# Patient Record
Sex: Female | Born: 1990 | Race: White | Hispanic: No | Marital: Single | Smoking: Never smoker
Health system: Southern US, Community
[De-identification: ages and names within clinical notes are randomized; demographics above are authoritative.]

---

## 2015-02-11 ENCOUNTER — Ambulatory Visit (INDEPENDENT_AMBULATORY_CARE_PROVIDER_SITE_OTHER): Payer: 59 | Admitting: Emergency Medicine

## 2015-02-11 VITALS — BP 110/78 | HR 60 | Temp 97.7°F | Resp 16 | Ht 68.25 in | Wt 153.0 lb

## 2015-02-11 DIAGNOSIS — J029 Acute pharyngitis, unspecified: Secondary | ICD-10-CM

## 2015-02-11 LAB — POCT RAPID STREP A (OFFICE): RAPID STREP A SCREEN: NEGATIVE

## 2015-02-11 NOTE — Progress Notes (Signed)
Subjective:  Patient ID: Rose Jennings, female    DOB: 02/16/1990  Age: 25 y.o. MRN: [id number]  CC: Sore Throat   HPI Saja Bartolini presents   Tameika has a sore throat. She is concerned that she might have strep which. 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 She has no fever chills. No nasal congestion postnasal drainage. She has no cough wheezing or shortness of breath. She has no nausea or vomiting. Has no rash.  History Lyza has no past medical history on file.   She has no past surgical history on file.   Her  family history is not on file.  She   reports that she has never smoked. She does not have any smokeless tobacco history on file. Her alcohol and drug histories are not on file.  No outpatient prescriptions prior to visit.   No facility-administered medications prior to visit.    Social History   Social History  . Marital Status: Single    Spouse Name: N/A  . Number of Children: N/A  . Years of Education: N/A   Social History Main Topics  . Smoking status: Never Smoker   . Smokeless tobacco: None  . Alcohol Use: None  . Drug Use: None  . Sexual Activity: Not Asked   Other Topics Concern  . None   Social History Narrative  . None     Review of Systems  Constitutional: Negative for fever, chills and appetite change.  HENT: Positive for sore throat. Negative for congestion, ear pain, postnasal drip and sinus pressure.   Eyes: Negative for pain and redness.  Respiratory: Negative for cough, shortness of breath and wheezing.   Cardiovascular: Negative for leg swelling.  Gastrointestinal: Negative for nausea, vomiting, abdominal pain, diarrhea, constipation and blood in stool.  Endocrine: Negative for polyuria.  Genitourinary: Negative for dysuria, urgency, frequency and flank pain.  Musculoskeletal: Negative for gait problem.  Skin: Negative for rash.    Neurological: Negative for weakness and headaches.  Psychiatric/Behavioral: Negative for confusion and decreased concentration. The patient is not nervous/anxious.     Objective:  BP 110/78 mmHg  Pulse 60  Temp(Src) 97.7 F (36.5 C) (Oral)  Resp 16  Ht 5' 8.25" (1.734 m)  Wt 153 lb (69.4 kg)  BMI 23.08 kg/m2  SpO2 99%  LMP 02/11/2015  Physical Exam  Constitutional: She is oriented to person, place, and time. She appears well-developed and well-nourished. No distress.  HENT:  Head: Normocephalic and atraumatic.  Right Ear: External ear normal.  Left Ear: External ear normal.  Nose: Nose normal.  Eyes: Conjunctivae and EOM are normal. Pupils are equal, round, and reactive to light. No scleral icterus.  Neck: Normal range of motion. Neck supple. No tracheal deviation present.  Cardiovascular: Normal rate, regular rhythm and normal heart sounds.   Pulmonary/Chest: Effort normal. No respiratory distress. She has no wheezes. She has no rales.  Abdominal: She exhibits no mass. There is no tenderness. There is no rebound and no guarding.  Musculoskeletal: She exhibits no edema.  Lymphadenopathy:    She has no cervical adenopathy.  Neurological: She is alert and oriented to person, place, and time. Coordination normal.  Skin: Skin is warm and dry. No rash noted.  Psychiatric: She has a normal mood and affect. Her behavior is normal.      Assessment & Plan:   Magda Paganini  was seen today for sore throat.  Diagnoses and all orders for this visit:  Pharyngitis -     POCT rapid strep A   Ms. Velda ShellHubert does not currently have medications on file.  No orders of the defined types were placed in this encounter.    Appropriate red flag conditions were discussed with the patient as well as actions that should be taken.  Patient expressed his understanding.  Follow-up: Return if symptoms worsen or fail to improve.  Carmelina DaneAnderson, Jeffery S, MD

## 2015-02-11 NOTE — Patient Instructions (Signed)

## 2015-02-26 ENCOUNTER — Ambulatory Visit (INDEPENDENT_AMBULATORY_CARE_PROVIDER_SITE_OTHER): Payer: 59 | Admitting: Physician Assistant

## 2015-02-26 ENCOUNTER — Ambulatory Visit (INDEPENDENT_AMBULATORY_CARE_PROVIDER_SITE_OTHER): Payer: 59

## 2015-02-26 VITALS — BP 110/70 | HR 63 | Temp 99.2°F | Resp 18 | Ht 69.0 in | Wt 153.6 lb

## 2015-02-26 DIAGNOSIS — S92912A Unspecified fracture of left toe(s), initial encounter for closed fracture: Secondary | ICD-10-CM | POA: Diagnosis not present

## 2015-02-26 DIAGNOSIS — M79675 Pain in left toe(s): Secondary | ICD-10-CM | POA: Diagnosis not present

## 2015-02-26 NOTE — Patient Instructions (Addendum)
Because you received an x-ray today, you will receive an invoice from [facility]. Please contact [facility] at [phone] with questions or concerns regarding your invoice. Our billing staff will not be able to assist you with those questions.  Wear the post-op shoe for comfort. When you can wear a normal shoe without pain, you may.  Use OTC ibuprofen and/or acetaminophen as needed for pain.  Ice today and tomorrow (15-20 minutes 2-4 times each day) can help reduce the inflammation and pain.  Keep your foot elevated whenever you can. Minimize standing and walking as best you can.

## 2015-02-26 NOTE — Progress Notes (Signed)
   Patient ID: Rose Jennings, female    DOB: Oct 10, 1990, 25 y.o.   MRN: [id number]  PCP: No primary care provider on file.  Subjective:   Chief Complaint  Patient presents with  . Toe Injury    Poss. broken, yesterday  left side    HPI Presents for evaluation of a possible broken first digit of the left foot.   Acute injury occurred yesterday when a table fell from aproximately 4 feet and landed directly on the toe. Associated swelling, erythema, bruising, and difficulty with movement or ambulating. Denies radiation, including extension into ankle other digits of the foot. Has tried  Ibuprofen on 3 occassions that has helped relieve the pain. Constant pain is rated 5/10, with attempts to ambulate or move rated 6-7/10. Denies fever, chills, or numbness. .  She is accompanied by her sister.    Review of Systems  Musculoskeletal: Positive for arthralgias (LEFT great toe).  Skin: Color change: bruising, LEFT great toe.  Neurological: Negative for dizziness, weakness and numbness.       There are no active problems to display for this patient.    Prior to Admission medications   Not on File     Allergies  Allergen Reactions  . Sulfa Antibiotics     bruises       Objective:  Physical Exam  Constitutional: She is oriented to person, place, and time. She appears well-developed and well-nourished. She is active and cooperative. No distress.  BP 110/70 mmHg  Pulse 63  Temp(Src) 99.2 F (37.3 C) (Oral)  Resp 18  Ht  (1.753 m)  Wt 153 lb 9.6 oz (69.673 kg)  BMI 22.67 kg/m2  SpO2 98%  LMP 02/11/2015   Eyes: Conjunctivae are normal.  Pulmonary/Chest: Effort normal.  Musculoskeletal:       Left foot: There is tenderness, bony tenderness and swelling. There is normal range of motion, normal capillary refill, no crepitus, no deformity and no laceration.       Feet:  Neurological: She is alert and oriented to person, place, and time.  Psychiatric: She has a  normal mood and affect. Her speech is normal and behavior is normal.       Dg Toe Great Left  02/26/2015  CLINICAL DATA:  Crush injury to toe.  Dropped a table 1 it. EXAM: LEFT GREAT TOE COMPARISON:  None. FINDINGS: There is a corner fracture involving the lateral base of the great toe distal phalanx. Otherwise no acute bony abnormality is evident. Overlying soft tissues are unremarkable. IMPRESSION: Tiny corner fracture involving the lateral base of the great toe distal phalanx. Electronically Signed   By: Kennith Center M.D.   On: 02/26/2015 15:18        Assessment & Plan:   1. Pain of toe of left foot 2. Fracture, phalanx, foot, left, closed, initial encounter Fx of the LEFT great toe. Non-displaced. Post-op shoe. OTC NSAIDS. Anticipatory guidance.  - DG Toe Great Left; Future  Return for re-evaluation in 4-6 weeks.    Fernande Bras, PA-C Physician Assistant-Certified [facility]

## 2015-02-26 NOTE — Progress Notes (Signed)
   Subjective:    Patient ID: Rose Jennings, female    DOB: [birth year], 25 y.o.   MRN: [id number]  Chief Complaint  Patient presents with  . Toe Injury    Poss. broken, yesterday  left side    HPI Patient presents with a possible broken first digit of the left foot. Acute injury occurred yesterday when a table fell from aproximately 4 feet and landed directly on the toe. Associated swelling, erythema, bruising, and difficulty with movement or ambulating. Denies radiation, including extension into ankle other digits of the foot.  Has tried  Ibuprofen on 3 occassions that has helped relieve the pain. Constant pain is rated 5/10, with attempts to ambulate or move rated 6-7/10. Denies fever, chills, or numbness.   Review of Systems  Constitutional: Negative for fever and chills.  Neurological: Negative for numbness.   Allergies  Allergen Reactions  . Sulfa Antibiotics     bruises   Prior to Admission medications   Not on File   There are no active problems to display for this patient.     Objective:   Physical Exam  Constitutional: She appears well-developed and well-nourished. No distress.  BP 110/70 mmHg  Pulse 63  Temp(Src) 99.2 F (37.3 C) (Oral)  Resp 18  Ht  (1.753 m)  Wt 153 lb 9.6 oz (69.673 kg)  BMI 22.67 kg/m2  SpO2 98%  LMP 02/11/2015    MSK:  Ankle: No bony deformities, no tenderness on palpation. ROM normal and symmetric with flexion, extension, inversion and eversion b/l. Srength 5+ B/l Right foot: No bony deformities. No tenderness on palpation. Digits ROM intact.  Left Foot: No palpable deformities. Bony tenderness with compression located from the first digit extending into the medial foot. Limited active ROM of digits with flexion. Normal ROM with extension. Normal passive ROM on flexion.   Neuro Sharp and dull sensation intact b/l ankles, feet and digits. Notable antalgic gait favoring ipsilateral leg to injured toe. CVD Posterior tibalis and  dorsalis pedis pulses 2+ b/l.    Dg Toe Great Left  02/26/2015  CLINICAL DATA:  Crush injury to toe.  Dropped a table 1 it. EXAM: LEFT GREAT TOE COMPARISON:  None. FINDINGS: There is a corner fracture involving the lateral base of the great toe distal phalanx. Otherwise no acute bony abnormality is evident. Overlying soft tissues are unremarkable. IMPRESSION: Tiny corner fracture involving the lateral base of the great toe distal phalanx. Electronically Signed   By: Kennith Center M.D.   On: 02/26/2015 15:18      Assessment & Plan:  1. Pain of toe of left foot 2. Fracture, phalanx, foot, left, closed, initial encounter - DG Toe Great Left; Future  Return for re-evaluation in 4-6 weeks.

## 2016-08-31 ENCOUNTER — Ambulatory Visit (INDEPENDENT_AMBULATORY_CARE_PROVIDER_SITE_OTHER): Payer: 59 | Admitting: Physician Assistant

## 2016-08-31 ENCOUNTER — Encounter: Payer: Self-pay | Admitting: Physician Assistant

## 2016-08-31 VITALS — BP 116/76 | HR 70 | Temp 98.4°F | Resp 17 | Ht 67.0 in | Wt 161.5 lb

## 2016-08-31 DIAGNOSIS — R3989 Other symptoms and signs involving the genitourinary system: Secondary | ICD-10-CM

## 2016-08-31 DIAGNOSIS — R300 Dysuria: Secondary | ICD-10-CM

## 2016-08-31 DIAGNOSIS — Z7251 High risk heterosexual behavior: Secondary | ICD-10-CM | POA: Diagnosis not present

## 2016-08-31 DIAGNOSIS — A6000 Herpesviral infection of urogenital system, unspecified: Secondary | ICD-10-CM

## 2016-08-31 DIAGNOSIS — R350 Frequency of micturition: Secondary | ICD-10-CM | POA: Diagnosis not present

## 2016-08-31 LAB — POCT URINALYSIS DIP (MANUAL ENTRY)
BILIRUBIN UA: NEGATIVE
GLUCOSE UA: NEGATIVE mg/dL
Ketones, POC UA: NEGATIVE mg/dL
NITRITE UA: NEGATIVE
Protein Ur, POC: NEGATIVE mg/dL
Spec Grav, UA: 1.01 (ref 1.010–1.025)
Urobilinogen, UA: 0.2 E.U./dL
pH, UA: 6.5 (ref 5.0–8.0)

## 2016-08-31 LAB — POCT WET + KOH PREP
TRICH BY WET PREP: ABSENT
YEAST BY KOH: ABSENT
Yeast by wet prep: ABSENT

## 2016-08-31 LAB — POC MICROSCOPIC URINALYSIS (UMFC): MUCUS RE: ABSENT

## 2016-08-31 MED ORDER — VALACYCLOVIR HCL 1 G PO TABS: 1000.0000 mg | ORAL_TABLET | Freq: Two times a day (BID) | ORAL | 0 refills | Status: AC

## 2016-08-31 MED ORDER — VALACYCLOVIR HCL 500 MG PO TABS: ORAL_TABLET | ORAL | 1 refills | Status: AC

## 2016-08-31 MED ORDER — NITROFURANTOIN MONOHYD MACRO 100 MG PO CAPS: 100.0000 mg | ORAL_CAPSULE | Freq: Two times a day (BID) | ORAL | 0 refills | Status: AC

## 2016-08-31 NOTE — Patient Instructions (Addendum)
Your physical exam findings are consistent with genital HSV. We should have your lab results back next week and will contact you with these results. In the meantime, take valtrex as prescribed for first episode. I have given you a refill for this medication to use for future recurrences. The dosing is different for the first episode and the recurrence so make sure you check the Rx bottles when you pick them up. Contact our office if you have any questions. I recommend refraining from sexual intercourse until all results are back and using condoms in the future during sexual intercourse.   Your results indicate you have a UTI. I have given you a prescription for an antibiotic. Please take with food. I have sent off a urine culture and we should have those results in 48 hours. If your symptoms worsen while you are awaiting these results or you develop fever, chills, flank pian, nausea and vomiting, please seek care immediately.   Thank you for letting me participate in your health and well being.  Genital Herpes Genital herpes is a common sexually transmitted infection (STI) that is caused by a virus. The virus spreads from person to person through sexual contact. Infection can cause itching, blisters, and sores around the genitals or rectum. Symptoms may last several days and then go away This is called an outbreak. However, the virus remains in your body, so you may have more outbreaks in the future. The time between outbreaks varies and can be months or years. Genital herpes affects men and women. It is particularly concerning for pregnant women because the virus can be passed to the baby during delivery and can cause serious problems. Genital herpes is also a concern for people who have a weak disease-fighting (immune) system. What are the causes? This condition is caused by the herpes simplex virus (HSV) type 1 or type 2. The virus may spread through:  Sexual contact with an infected person,  including vaginal, anal, and oral sex.  Contact with fluid from a herpes sore.  The skin. This means that you can get herpes from an infected partner even if he or she does not have a visible sore or does not know that he or she is infected.  What increases the risk? You are more likely to develop this condition if:  You have sex with many partners.  You do not use latex condoms during sex.  What are the signs or symptoms? Most people do not have symptoms (asymptomatic) or have mild symptoms that may be mistaken for other skin problems. Symptoms may include:  Small red bumps near the genitals, rectum, or mouth. These bumps turn into blisters and then turn into sores.  Flu-like symptoms, including: ? Fever. ? Body aches. ? Swollen lymph nodes. ? Headache.  Painful urination.  Pain and itching in the genital area or rectal area.  Vaginal discharge.  Tingling or shooting pain in the legs and buttocks.  Generally, symptoms are more severe and last longer during the first (primary) outbreak. Flu-like symptoms are also more common during the primary outbreak. How is this diagnosed? Genital herpes may be diagnosed based on:  A physical exam.  Your medical history.  Blood tests.  A test of a fluid sample (culture) from an open sore.  How is this treated? There is no cure for this condition, but treatment with antiviral medicines that are taken by mouth (orally) can do the following:  Speed up healing and relieve symptoms.  Help to reduce  the spread of the virus to sexual partners.  Limit the chance of future outbreaks, or make future outbreaks shorter.  Lessen symptoms of future outbreaks.  Your health care provider may also recommend pain relief medicines, such as aspirin or ibuprofen. Follow these instructions at home: Sexual activity  Do not have sexual contact during active outbreaks.  Practice safe sex. Latex condoms and female condoms may help prevent the  spread of the herpes virus. General instructions  Keep the affected areas dry and clean.  Take over-the-counter and prescription medicines only as told by your health care provider.  Avoid rubbing or touching blisters and sores. If you do touch blisters or sores: ? Wash your hands thoroughly with soap and water. ? Do not touch your eyes afterward.  To help relieve pain or itching, you may take the following actions as directed by your health care provider: ? Apply a cold, wet cloth (cold compress) to affected areas 4-6 times a day. ? Apply a substance that protects your skin and reduces bleeding (astringent). ? Apply a gel that helps relieve pain around sores (lidocaine gel). ? Take a warm, shallow bath that cleans the genital area (sitz bath).  Keep all follow-up visits as told by your health care provider. This is important. How is this prevented?  Use condoms. Although anyone can get genital herpes during sexual contact, even with the use of a condom, a condom can provide some protection.  Avoid having multiple sexual partners.  Talk with your sexual partner about any symptoms either of you may have. Also, talk with your partner about any history of STIs.  Get tested for STIs before you have sex. Ask your partner to do the same.  Do not have sexual contact if you have symptoms of genital herpes. Contact a health care provider if:  Your symptoms are not improving with medicine.  Your symptoms return.  You have new symptoms.  You have a fever.  You have abdominal pain.  You have redness, swelling, or pain in your eye.  You notice new sores on other parts of your body.  You are a woman and experience bleeding between menstrual periods.  You have had herpes and you become pregnant or plan to become pregnant. Summary  Genital herpes is a common sexually transmitted infection (STI) that is caused by the herpes simplex virus (HSV) type 1 or type 2.  These viruses are  most often spread through sexual contact with an infected person.  You are more likely to develop this condition if you have sex with many partners or you have unprotected sex.  Most people do not have symptoms (asymptomatic) or have mild symptoms that may be mistaken for other skin problems. Symptoms occur as outbreaks that may happen months or years apart.  There is no cure for this condition, but treatment with oral antiviral medicines can reduce symptoms, reduce the chance of spreading the virus to a partner, prevent future outbreaks, or shorten future outbreaks. This information is not intended to replace advice given to you by your health care provider. Make sure you discuss any questions you have with your health care provider. Document Released: 01/12/2000 Document Revised: 12/15/2015 Document Reviewed: 12/15/2015 Elsevier Interactive Patient Education  2017 Elsevier Inc.   Urinary Tract Infection, Adult A urinary tract infection (UTI) is an infection of any part of the urinary tract. The urinary tract includes the:  Kidneys.  Ureters.  Bladder.  Urethra.  These organs make, store, and get rid  of pee (urine) in the body. Follow these instructions at home:  Take over-the-counter and prescription medicines only as told by your doctor.  If you were prescribed an antibiotic medicine, take it as told by your doctor. Do not stop taking the antibiotic even if you start to feel better.  Avoid the following drinks: ? Alcohol. ? Caffeine. ? Tea. ? Carbonated drinks.  Drink enough fluid to keep your pee clear or pale yellow.  Keep all follow-up visits as told by your doctor. This is important.  Make sure to: ? Empty your bladder often and completely. Do not to hold pee for long periods of time. ? Empty your bladder before and after sex. ? Wipe from front to back after a bowel movement if you are female. Use each tissue one time when you wipe. Contact a doctor if:  You have  back pain.  You have a fever.  You feel sick to your stomach (nauseous).  You throw up (vomit).  Your symptoms do not get better after 3 days.  Your symptoms go away and then come back. Get help right away if:  You have very bad back pain.  You have very bad lower belly (abdominal) pain.  You are throwing up and cannot keep down any medicines or water. This information is not intended to replace advice given to you by your health care provider. Make sure you discuss any questions you have with your health care provider. Document Released: 07/03/2007 Document Revised: 06/22/2015 Document Reviewed: 12/05/2014 Elsevier Interactive Patient Education  2018 [employer].    IF you received an x-ray today, you will receive an invoice from [facility]. Please contact [facility] at [phone] with questions or concerns regarding your invoice.   IF you received labwork today, you will receive an invoice from [facility]. Please contact [facility] at [phone] with questions or concerns regarding your invoice.   Our billing staff will not be able to assist you with questions regarding bills from these companies.  You will be contacted with the lab results as soon as they are available. The fastest way to get your results is to activate your My Chart account. Instructions are located on the last page of this paperwork. If you have not heard from [place] regarding the results in 2 weeks, please contact this office.

## 2016-08-31 NOTE — Progress Notes (Signed)
Rose Jennings  MRN: [id number] DOB: 11/29/1990  Subjective:  Rose Jennings is a 26 y.o. female seen in office today for a chief complaint of genital sores x 3 days ago. Has associated tingling, burning, and urinary freqency. Denies vaginal itching, vaginal discharge, dysuria, urinary frequency, and hematuria. Last sexually active in 03/2016. Has had 4 lifetime partners. Does not use condoms every time. Has never been tested for STDs.LMP >2 months. Just stopped birth control tablets one month ago. She was skipping placebo pills so she would not have a cycle. For the past week she has had flu like sx, such as general malaise, headache, decreased appetite, and low grade fever all of which have resolved.   Review of Systems  Gastrointestinal: Negative for abdominal pain and nausea.    There are no active problems to display for this patient.   No current outpatient prescriptions on file prior to visit.   No current facility-administered medications on file prior to visit.     Allergies  Allergen Reactions  . Sulfa Antibiotics     bruises     Objective:  BP 116/76   Pulse 70   Temp 98.4 F (36.9 C) (Oral)   Resp 17   Ht 5\' 7"  (1.702 m)   Wt 161 lb 8 oz (73.3 kg)   LMP  (LMP Unknown)   SpO2 100%   BMI 25.29 kg/m   Physical Exam  Constitutional: She is oriented to person, place, and time and well-developed, well-nourished, and in no distress.  HENT:  Head: Normocephalic and atraumatic.  Eyes: Conjunctivae are normal.  Neck: Normal range of motion.  Pulmonary/Chest: Effort normal.  Genitourinary: Vulva exhibits lesion ( Erythematous vesicular lesions on labia minora, exquisitely tender to palpation  ). Thin  white and vaginal discharge found.  Neurological: She is alert and oriented to person, place, and time. Gait normal.  Skin: Skin is warm and dry.  Psychiatric: Affect normal.  Vitals reviewed.   Results for orders placed or performed in visit on 08/31/16 (from the  past 24 hour(s))  POCT urinalysis dipstick     Status: Abnormal   Collection Time: 08/31/16 12:06 PM  Result Value Ref Range   Color, UA yellow yellow   Clarity, UA clear clear   Glucose, UA negative negative mg/dL   Bilirubin, UA negative negative   Ketones, POC UA negative negative mg/dL   Spec Grav, UA 4.0981.010 [id number] - 1.025   Blood, UA trace-lysed (A) negative   pH, UA 6.5 5.0 - 8.0   Protein Ur, POC negative negative mg/dL   Urobilinogen, UA 0.2 0.2 or 1.0 E.U./dL   Nitrite, UA Negative Negative   Leukocytes, UA Small (1+) (A) Negative  POCT Microscopic Urinalysis ([facility])     Status: Abnormal   Collection Time: 08/31/16 12:06 PM  Result Value Ref Range   WBC,UR,HPF,POC Few (A) None WBC/hpf   RBC,UR,HPF,POC None None RBC/hpf   Bacteria Moderate (A) None, Too numerous to count   Mucus Absent Absent   Epithelial Cells, UR Per Microscopy Few (A) None, Too numerous to count cells/hpf  POCT Wet + KOH Prep     Status: Abnormal   Collection Time: 08/31/16 12:06 PM  Result Value Ref Range   Yeast by KOH Absent Absent   Yeast by wet prep Absent Absent   WBC by wet prep Moderate (A) Few   Clue Cells Wet Prep HPF POC None None   Trich by wet prep Absent Absent   Bacteria Wet  Prep HPF POC Many (A) Few   Epithelial Cells By [employer] FinancialWet Pref ([facility]) Moderate (A) None, Few, Too numerous to count   RBC,UR,HPF,POC None None RBC/hpf    Assessment and Plan :  1. Genital sore 2. High risk sexual behavior Labs pending. Pt DECLINES pregnancy test.  - HIV antibody - GC/Chlamydia Probe Amp - RPR - POCT urinalysis dipstick - POCT Microscopic Urinalysis ([facility]) - POCT Wet + KOH Prep - Hepatitis panel, acute  3. Urinary frequency 4. Dysuria UA dip with small leukocytes and urine micro with few WBCs, will treat empirically for UTI. Urine culture pending.  - Urine Culture - nitrofurantoin, macrocrystal-monohydrate, (MACROBID) 100 MG capsule; Take 1 capsule (100 mg total) by mouth 2 (two) times daily.   Dispense: 10 capsule; Refill: 0  5. Genital herpes simplex, unspecified site Hx and PE findings consistent with genital herpes. Will treat empirically at this time. Given 2 Rx's for valtrex, one for 1st episode and one for recurrences. Discussed safe sex practices with patient. Plan to follow up as needed.  - valACYclovir (VALTREX) 1000 MG tablet; Take 1 tablet (1,000 mg total) by mouth 2 (two) times daily. For first outbreak.  Dispense: 20 tablet; Refill: 0 - valACYclovir (VALTREX) 500 MG tablet; For recurrences, take 500mg  BID x 3 days. Start ASAP w/in 24 hours of sx onset.  Dispense: 12 tablet; Refill: 1   Benjiman CoreBrittany Jearl Soto, PA-C  [facility] 08/31/2016 12:44 PM

## 2016-09-02 LAB — HERPES SIMPLEX VIRUS CULTURE

## 2016-09-03 ENCOUNTER — Telehealth: Payer: Self-pay | Admitting: Physician Assistant

## 2016-09-03 LAB — GC/CHLAMYDIA PROBE AMP
Chlamydia trachomatis, NAA: NEGATIVE
Neisseria gonorrhoeae by PCR: NEGATIVE

## 2016-09-03 NOTE — Telephone Encounter (Signed)
[facility] TEST[facility]. SHE WOULD LIKE TO GET THE RESULTS PLEASE. BEST PHONE [phone] (CELL) PHARMACY CHOICE IF NEEDED IS [facility] ON [address] [facility]

## 2016-09-04 LAB — HEPATITIS PANEL, ACUTE
HEP B C IGM: POSITIVE — AB
Hep A IgM: NEGATIVE
Hep C Virus Ab: <0.1 s/co ratio (ref 0.0–0.9)
Hepatitis B Surface Ag: NEGATIVE

## 2016-09-04 LAB — RPR: RPR: NONREACTIVE

## 2016-09-04 LAB — HIV ANTIBODY (ROUTINE TESTING W REFLEX): HIV SCREEN 4TH GENERATION: NONREACTIVE

## 2016-09-04 NOTE — Telephone Encounter (Signed)
Please release labs. I see that all are negative except for Hep B.

## 2016-09-04 NOTE — Telephone Encounter (Signed)
I am waiting on urine culture results before I call pt. Thanks!

## 2016-09-05 LAB — URINE CULTURE: Organism ID, Bacteria: NO GROWTH

## 2016-09-06 ENCOUNTER — Other Ambulatory Visit: Payer: Self-pay | Admitting: Physician Assistant

## 2016-09-06 ENCOUNTER — Ambulatory Visit (INDEPENDENT_AMBULATORY_CARE_PROVIDER_SITE_OTHER): Payer: 59 | Admitting: Family Medicine

## 2016-09-06 DIAGNOSIS — Z23 Encounter for immunization: Secondary | ICD-10-CM

## 2016-09-06 DIAGNOSIS — R82998 Other abnormal findings in urine: Secondary | ICD-10-CM

## 2016-09-06 DIAGNOSIS — R8299 Other abnormal findings in urine: Secondary | ICD-10-CM

## 2016-09-06 DIAGNOSIS — R768 Other specified abnormal immunological findings in serum: Secondary | ICD-10-CM

## 2016-09-06 NOTE — Progress Notes (Signed)
Orders Placed This Encounter  Procedures  . Hepatitis B surface antibody    Standing Status:   Future    Standing Expiration Date:   09/20/2016  . Hepatitis B surface antigen    Standing Status:   Future    Standing Expiration Date:   09/20/2016  . Hepatitis B core antibody, IgM    Standing Status:   Future    Standing Expiration Date:   09/20/2016  . Hepatitis B core antibody, total    Standing Status:   Future    Standing Expiration Date:   09/20/2016

## 2016-09-06 NOTE — Progress Notes (Signed)
Orders Placed This Encounter  Procedures  . Urine Culture    Standing Status:   Future    Standing Expiration Date:   09/06/2017  . POCT urinalysis dipstick  . POCT Microscopic Urinalysis ([facility])

## 2016-09-07 LAB — URINE CULTURE: ORGANISM ID, BACTERIA: NO GROWTH

## 2016-09-07 LAB — HEPATITIS B SURFACE ANTIGEN: HEP B S AG: NEGATIVE

## 2016-09-07 LAB — HEPATITIS B SURFACE ANTIBODY, QUANTITATIVE: HEPATITIS B SURF AB QUANT: 347.9 m[IU]/mL

## 2016-09-07 LAB — HEPATITIS B CORE ANTIBODY, TOTAL: Hep B Core Total Ab: POSITIVE — AB

## 2016-09-07 LAB — HEPATITIS B CORE ANTIBODY, IGM: HEP B C IGM: UNDETERMINED — AB

## 2016-09-11 ENCOUNTER — Other Ambulatory Visit: Payer: Self-pay | Admitting: *Deleted

## 2016-09-11 ENCOUNTER — Telehealth: Payer: Self-pay | Admitting: Physician Assistant

## 2016-09-11 DIAGNOSIS — Z23 Encounter for immunization: Secondary | ICD-10-CM

## 2016-09-11 NOTE — Telephone Encounter (Signed)
Discussed lab results with Dr. Ninet[facility]atcher at [facility] and he believes that patient's hepatitis b labs are consistent with someone who is immune due to vaccination but has tested falsely positive to the hep b core. He recommended checking a liver function panel. If normal liver function panel and patient is asymptomatic, he advised no further testing or evaluation is needed. I have contacted patient and informed her of this conversation with Dr. Ninetta LightsHatcher. Will contact her back when we have liver function panel results and discuss further plan.

## 2016-09-12 LAB — HEPATIC FUNCTION PANEL
ALBUMIN: 4.5 g/dL (ref 3.5–5.5)
ALK PHOS: 252 IU/L — AB (ref 39–117)
ALT: 394 IU/L — ABNORMAL HIGH (ref 0–32)
AST: 236 IU/L — ABNORMAL HIGH (ref 0–40)
BILIRUBIN TOTAL: 0.4 mg/dL (ref 0.0–1.2)
BILIRUBIN, DIRECT: 0.19 mg/dL (ref 0.00–0.40)
TOTAL PROTEIN: 7.4 g/dL (ref 6.0–8.5)

## 2016-09-13 ENCOUNTER — Telehealth: Payer: Self-pay

## 2016-09-13 ENCOUNTER — Other Ambulatory Visit: Payer: Self-pay | Admitting: Physician Assistant

## 2016-09-13 ENCOUNTER — Telehealth: Payer: Self-pay | Admitting: Physician Assistant

## 2016-09-13 DIAGNOSIS — R7989 Other specified abnormal findings of blood chemistry: Secondary | ICD-10-CM

## 2016-09-13 DIAGNOSIS — R945 Abnormal results of liver function studies: Principal | ICD-10-CM

## 2016-09-13 NOTE — Telephone Encounter (Signed)
Advised pt that she has an [place] scheduled at 7am at [facility] [place] on 09/16/16

## 2016-09-13 NOTE — Telephone Encounter (Signed)
Contacted pt and informed that her liver enzymes were elevated. We are going to add an additional lab test and liver ultrasound, per Dr. Moshe Cipro recommendations. Pt understands and agrees to plan. Will contact pt with results and further treatment plan.

## 2016-09-13 NOTE — Progress Notes (Signed)
Orders Placed This Encounter  Procedures  . US Abdomen Limited RUQ    Standing Status:   Future    Standing Expiration Date:   11/13/2017    Order Specific Question:   Reason for Exam (SYMPTOM  OR DIAGNOSIS REQUIRED)    Answer:   positive hepatitis b core and IGM with elevated LFTs    Order Specific Question:   Preferred imaging location?    Answer:   External

## 2016-09-16 ENCOUNTER — Ambulatory Visit (HOSPITAL_COMMUNITY)
Admission: RE | Admit: 2016-09-16 | Discharge: 2016-09-16 | Disposition: A | Discharge status: Home / Self Care | Payer: 59 | Source: Ambulatory Visit | Attending: Physician Assistant | Admitting: Physician Assistant

## 2016-09-16 DIAGNOSIS — R7989 Other specified abnormal findings of blood chemistry: Secondary | ICD-10-CM | POA: Diagnosis present

## 2016-09-16 DIAGNOSIS — R945 Abnormal results of liver function studies: Secondary | ICD-10-CM

## 2016-09-18 ENCOUNTER — Other Ambulatory Visit: Payer: Self-pay | Admitting: Physician Assistant

## 2016-09-18 DIAGNOSIS — R748 Abnormal levels of other serum enzymes: Secondary | ICD-10-CM

## 2016-09-18 NOTE — Progress Notes (Signed)
Orders Placed This Encounter  Procedures  . Hepatitis B DNA, Ultraquantitative, PCR    Standing Status:   Future    Standing Expiration Date:   10/02/2016

## 2016-09-19 ENCOUNTER — Ambulatory Visit (INDEPENDENT_AMBULATORY_CARE_PROVIDER_SITE_OTHER): Payer: 59 | Admitting: Emergency Medicine

## 2016-09-19 DIAGNOSIS — R748 Abnormal levels of other serum enzymes: Secondary | ICD-10-CM | POA: Diagnosis not present

## 2016-09-19 NOTE — Progress Notes (Signed)
Here for labs only

## 2016-09-20 LAB — HEPATITIS B DNA, ULTRAQUANTITATIVE, PCR: HBV DNA SERPL PCR-ACNC: NOT DETECTED IU/mL

## 2016-09-23 NOTE — Progress Notes (Signed)
Lab visit only; no provider encounter. 

## 2016-10-01 ENCOUNTER — Encounter: Payer: Self-pay | Admitting: Physician Assistant

## 2017-06-13 ENCOUNTER — Encounter: Payer: Self-pay | Admitting: Family Medicine

## 2017-06-18 ENCOUNTER — Encounter: Payer: Self-pay | Admitting: Family Medicine

## 2019-01-20 IMAGING — US US ABDOMEN LIMITED
1 series · 14 of 25 positions shown · non-contrast
Comparison: None.

CLINICAL DATA: Elevated LFTs

EXAM:
ULTRASOUND ABDOMEN LIMITED RIGHT UPPER QUADRANT

[Series 1: us abdomen limited · 0.20mm/px · 14 of 39 slices shown]
[im 1/39]
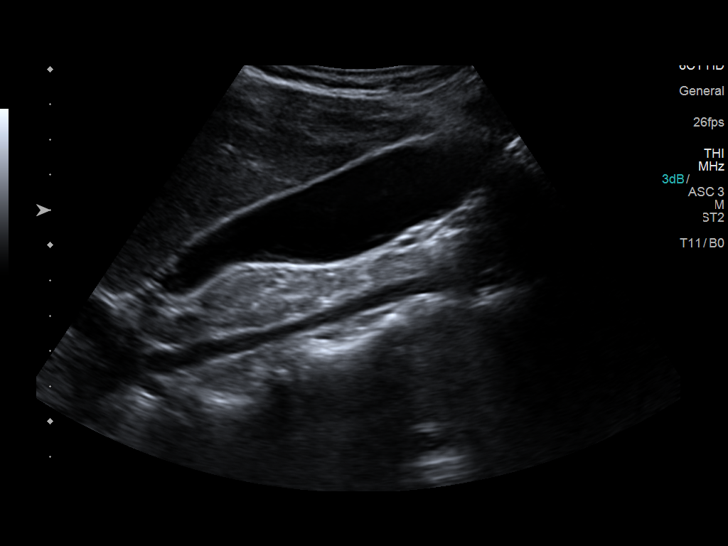
[im 4/39]
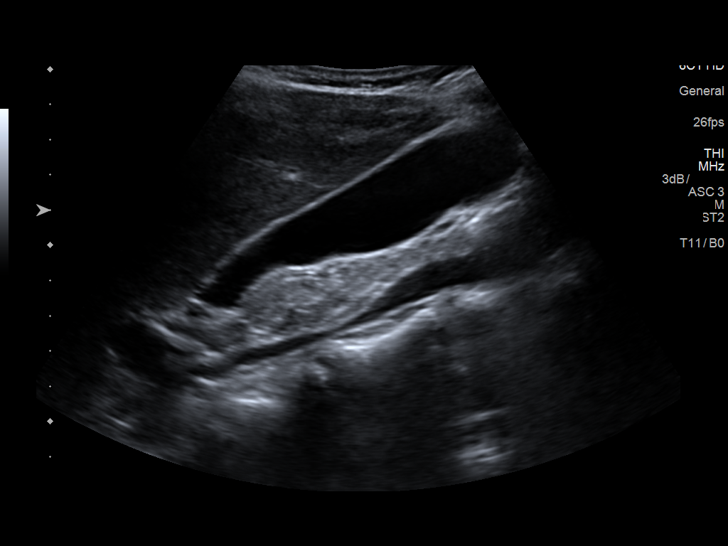
[im 7/39]
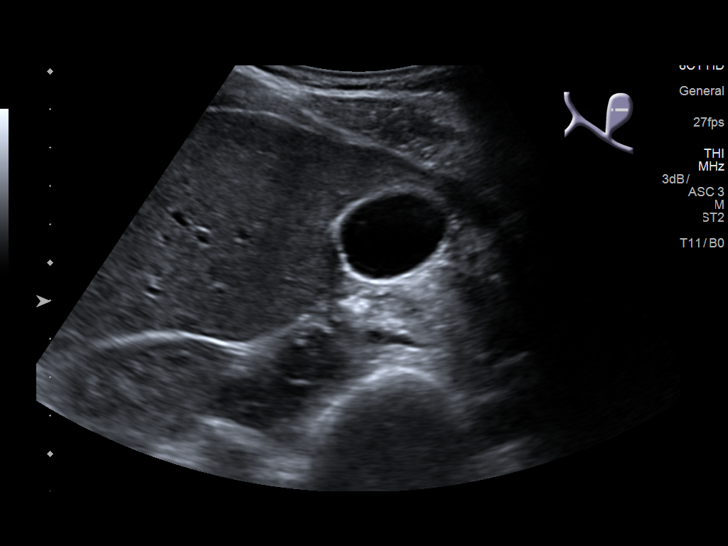
[im 10/39]
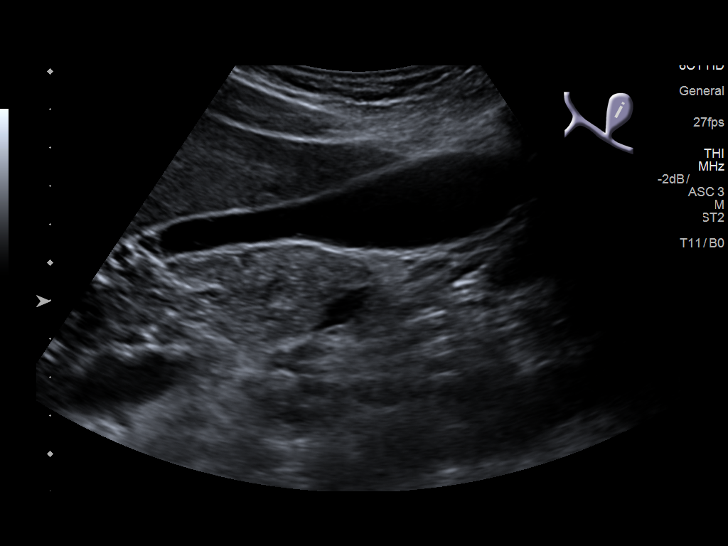
[im 13/39]
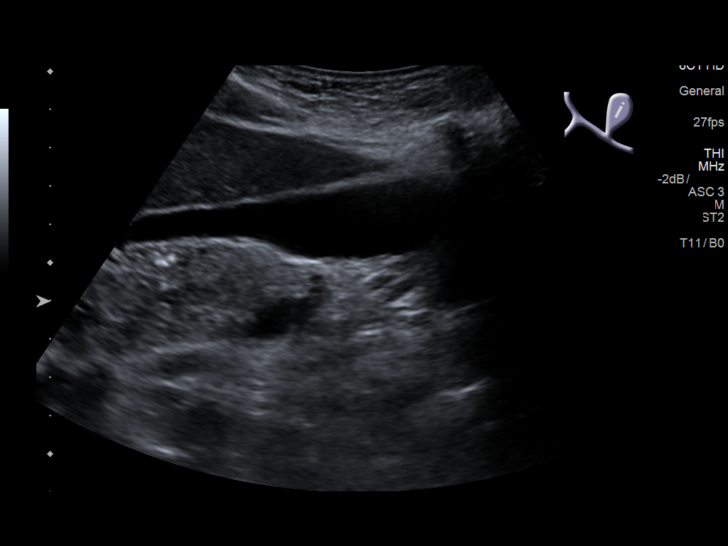
[im 15/39]
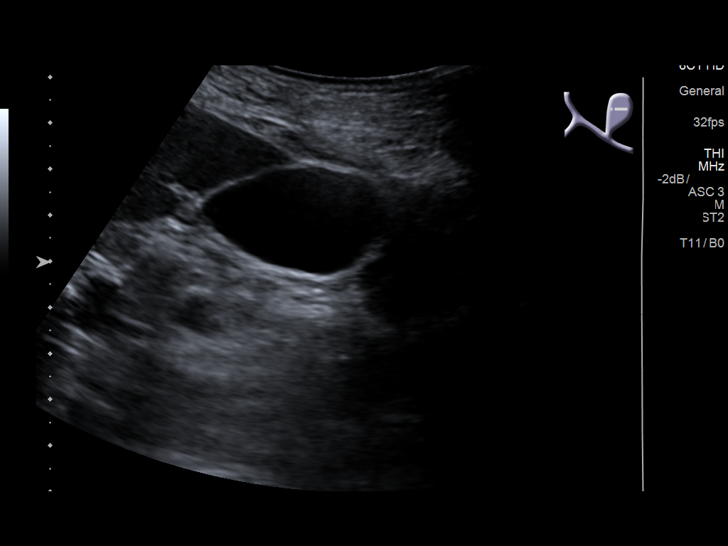
[im 18/39]
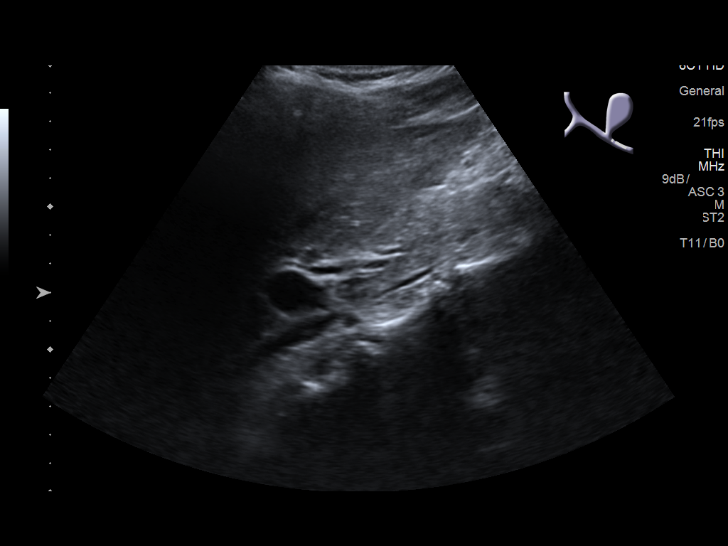
[im 21/39]
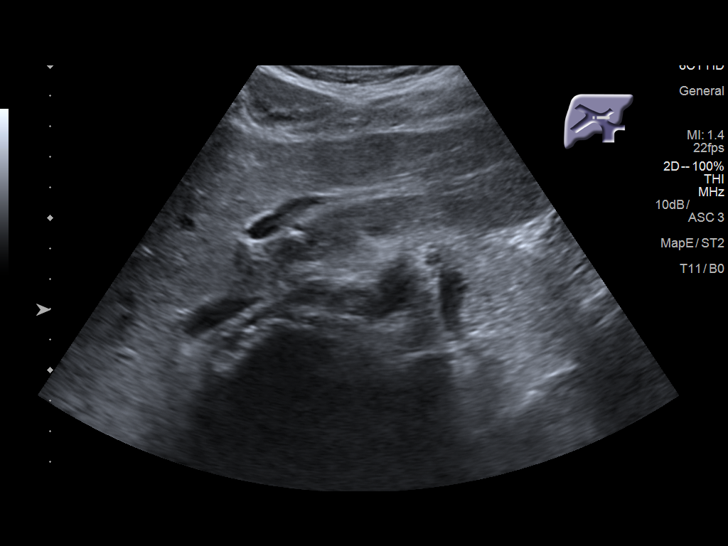
[im 24/39]
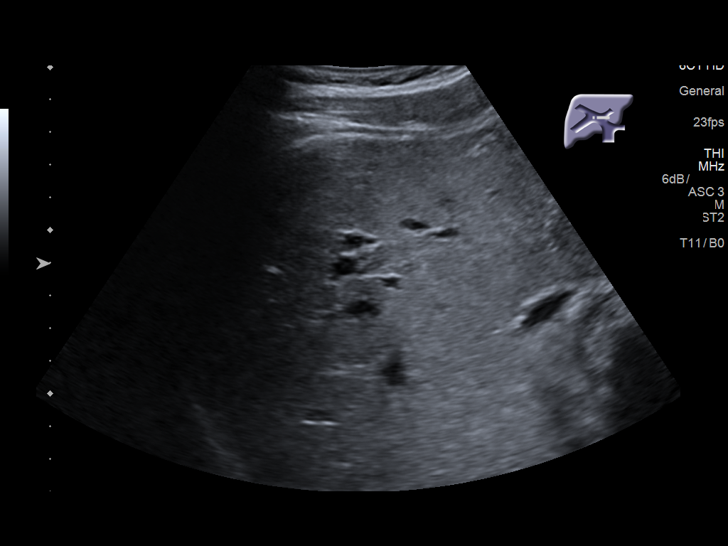
[im 26/39]
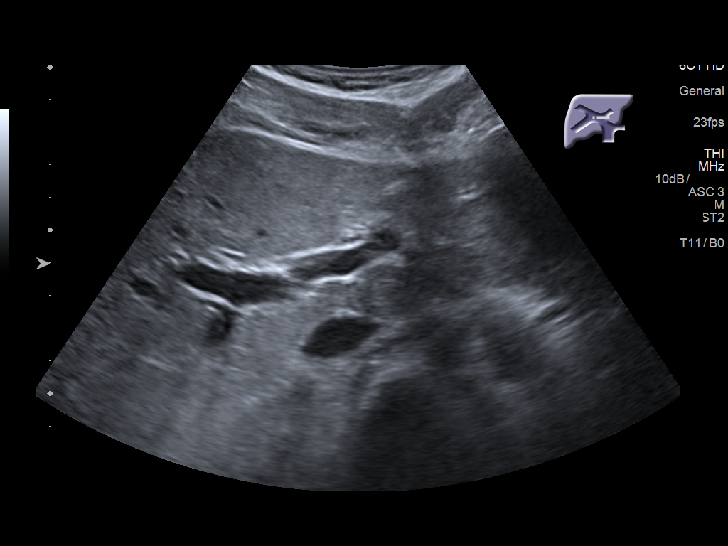
[im 29/39]
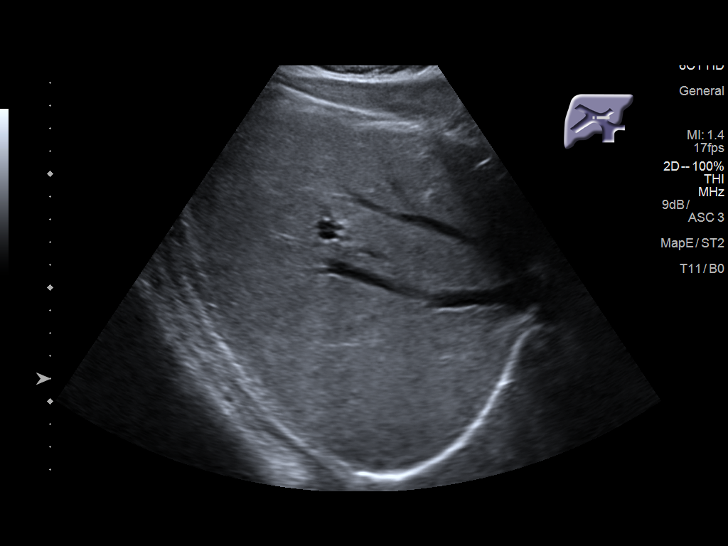
[im 32/39]
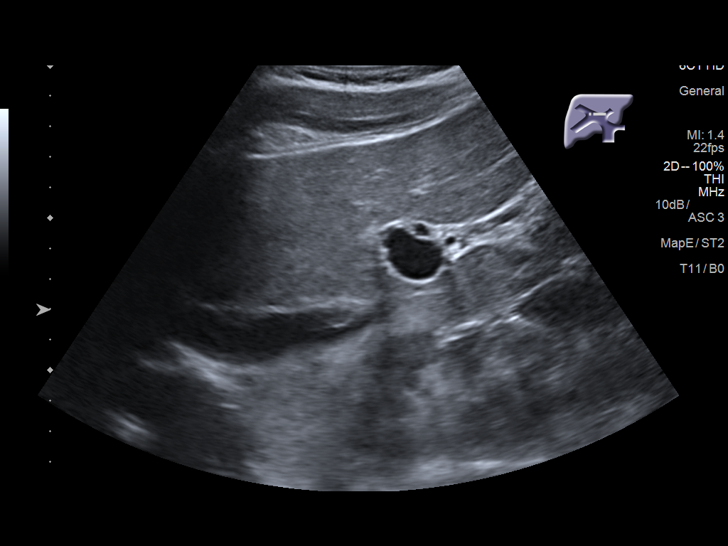
[im 35/39]
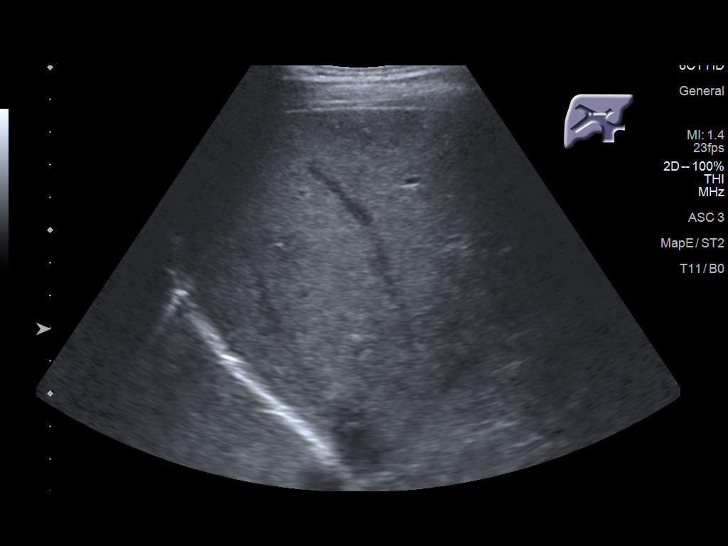
[im 39/39]
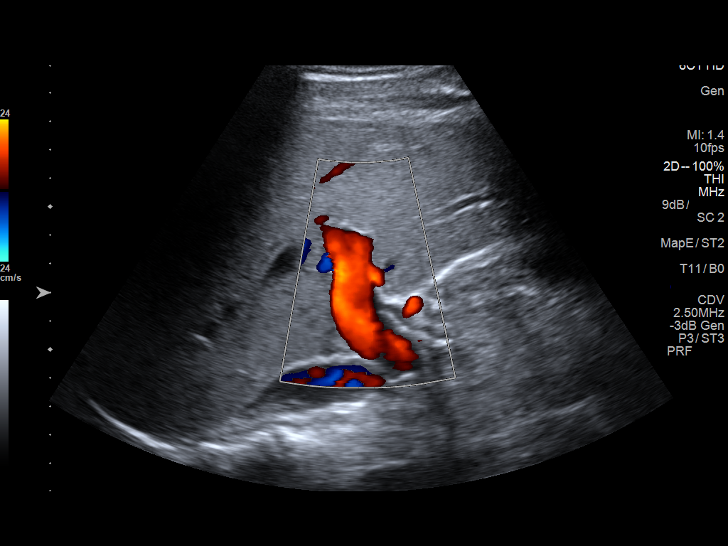

[14 of 25 positions shown; findings below may reference images not displayed]

FINDINGS: Gallbladder:

No gallstones or wall thickening visualized. No sonographic Murphy
sign noted by sonographer.

Common bile duct:

Diameter: 3.5 mm

Liver:

No focal lesion identified. Within normal limits in parenchymal
echogenicity. Portal vein is patent on color Doppler imaging with
normal direction of blood flow towards the liver.
IMPRESSION: No acute abnormality noted.
